# Patient Record
Sex: Male | Born: 1995 | Race: White | Hispanic: Yes | Marital: Single | Smoking: Never smoker
Health system: Southern US, Community
[De-identification: ages and names within clinical notes are randomized; demographics above are authoritative.]

## PROBLEM LIST (undated history)

## (undated) DIAGNOSIS — R109 Unspecified abdominal pain: Secondary | ICD-10-CM

## (undated) DIAGNOSIS — D649 Anemia, unspecified: Secondary | ICD-10-CM

## (undated) HISTORY — DX: Unspecified abdominal pain: R10.9

## (undated) HISTORY — PX: TONSILLECTOMY: SUR1361

## (undated) HISTORY — DX: Anemia, unspecified: D64.9

---
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
## 2015-01-21 ENCOUNTER — Emergency Department (HOSPITAL_COMMUNITY): Payer: No Typology Code available for payment source

## 2015-01-21 ENCOUNTER — Emergency Department (HOSPITAL_COMMUNITY)
Admission: EM | Admit: 2015-01-21 | Discharge: 2015-01-21 | Disposition: A | Discharge status: Home / Self Care | Payer: No Typology Code available for payment source | Attending: Emergency Medicine | Admitting: Emergency Medicine

## 2015-01-21 ENCOUNTER — Encounter (HOSPITAL_COMMUNITY): Payer: Self-pay | Admitting: *Deleted

## 2015-01-21 DIAGNOSIS — Y92410 Unspecified street and highway as the place of occurrence of the external cause: Secondary | ICD-10-CM | POA: Insufficient documentation

## 2015-01-21 DIAGNOSIS — Z862 Personal history of diseases of the blood and blood-forming organs and certain disorders involving the immune mechanism: Secondary | ICD-10-CM | POA: Insufficient documentation

## 2015-01-21 DIAGNOSIS — Y998 Other external cause status: Secondary | ICD-10-CM | POA: Insufficient documentation

## 2015-01-21 DIAGNOSIS — Y9389 Activity, other specified: Secondary | ICD-10-CM | POA: Insufficient documentation

## 2015-01-21 DIAGNOSIS — S4992XA Unspecified injury of left shoulder and upper arm, initial encounter: Secondary | ICD-10-CM | POA: Diagnosis present

## 2015-01-21 DIAGNOSIS — S3992XA Unspecified injury of lower back, initial encounter: Secondary | ICD-10-CM | POA: Insufficient documentation

## 2015-01-21 DIAGNOSIS — S46812A Strain of other muscles, fascia and tendons at shoulder and upper arm level, left arm, initial encounter: Secondary | ICD-10-CM

## 2015-01-21 DIAGNOSIS — S46912A Strain of unspecified muscle, fascia and tendon at shoulder and upper arm level, left arm, initial encounter: Secondary | ICD-10-CM | POA: Diagnosis not present

## 2015-01-21 DIAGNOSIS — S40012A Contusion of left shoulder, initial encounter: Secondary | ICD-10-CM

## 2015-01-21 MED ORDER — METHOCARBAMOL 500 MG PO TABS
500.0000 mg | ORAL_TABLET | Freq: Three times a day (TID) | ORAL | Status: DC
Start: 2015-01-21 — End: 2018-03-04

## 2015-01-21 MED ORDER — IBUPROFEN 800 MG PO TABS
800.0000 mg | ORAL_TABLET | Freq: Once | ORAL | Status: AC
  Administered 2015-01-21: 800 mg via ORAL
  Filled 2015-01-21: qty 1

## 2015-01-21 MED ORDER — IBUPROFEN 600 MG PO TABS: 600.0000 mg | ORAL_TABLET | Freq: Four times a day (QID) | ORAL | Status: DC | PRN

## 2015-01-21 MED ORDER — ACETAMINOPHEN 325 MG PO TABS
650.0000 mg | ORAL_TABLET | Freq: Once | ORAL | Status: AC
  Administered 2015-01-21: 650 mg via ORAL
  Filled 2015-01-21: qty 2
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
## 2015-09-14 ENCOUNTER — Encounter: Payer: Self-pay | Admitting: Family Medicine

## 2015-09-14 ENCOUNTER — Ambulatory Visit (INDEPENDENT_AMBULATORY_CARE_PROVIDER_SITE_OTHER): Payer: Self-pay | Admitting: Family Medicine

## 2015-09-14 VITALS — BP 132/70 | Temp 98.5°F | Ht 71.0 in | Wt 363.0 lb

## 2015-09-14 DIAGNOSIS — J329 Chronic sinusitis, unspecified: Secondary | ICD-10-CM

## 2015-09-14 DIAGNOSIS — J310 Chronic rhinitis: Secondary | ICD-10-CM

## 2015-09-14 MED ORDER — AZITHROMYCIN 250 MG PO TABS: ORAL_TABLET | ORAL | Status: DC

## 2015-09-14 NOTE — Progress Notes (Signed)
   Subjective:    Patient ID: Tyler Bautista, male    DOB: 06/21/1995, 20 y.o.   MRN: [id number]  Sinusitis This is a new problem. The current episode started in the past 7 days. The problem is unchanged. Maximum temperature: unmeasured. The pain is moderate. Associated symptoms include congestion, coughing, ear pain and headaches. Treatments tried: Advil. The treatment provided no relief.   Patient can only take pill form of medication. No liquids or syrups.   Pt has had bad cough, productive at times  Cong around cheeks and dim enrgy  Felt feve  Two night s ago,'  No known exposures except parents     Review of Systems  HENT: Positive for congestion and ear pain.   Respiratory: Positive for cough.   Neurological: Positive for headaches.       Objective:   Physical Exam  Alert mild malaise.Alert, mild malaise. Hydration good Vitals stable. frontal/ maxillary tenderness evident positive nasal congestion. pharynx normal neck supple  lungs clear/no crackles or wheezes. heart regular in rhythm       Assessment & Plan:  Impression rhinosinusitis likely post viral, discussed with patient. plan antibiotics prescribed. Questions answered. Symptomatic care discussed. warning signs discussed. WSL
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
## 2018-03-04 ENCOUNTER — Encounter (HOSPITAL_COMMUNITY): Payer: Self-pay | Admitting: Emergency Medicine

## 2018-03-04 ENCOUNTER — Ambulatory Visit (HOSPITAL_COMMUNITY)
Admission: EM | Admit: 2018-03-04 | Discharge: 2018-03-04 | Disposition: A | Discharge status: Home / Self Care | Payer: BLUE CROSS/BLUE SHIELD | Attending: Family Medicine | Admitting: Family Medicine

## 2018-03-04 DIAGNOSIS — B349 Viral infection, unspecified: Secondary | ICD-10-CM | POA: Diagnosis not present

## 2018-03-04 MED ORDER — BENZONATATE 100 MG PO CAPS: 100.0000 mg | ORAL_CAPSULE | Freq: Three times a day (TID) | ORAL | 0 refills | Status: AC

## 2018-03-04 MED ORDER — FLUTICASONE PROPIONATE 50 MCG/ACT NA SUSP: 2.0000 | Freq: Every day | NASAL | 0 refills | Status: AC

## 2018-03-04 MED ORDER — PREDNISONE 50 MG PO TABS: 50.0000 mg | ORAL_TABLET | Freq: Every day | ORAL | 0 refills | Status: AC

## 2018-03-04 MED ORDER — IPRATROPIUM BROMIDE 0.06 % NA SOLN: 2.0000 | Freq: Four times a day (QID) | NASAL | 0 refills | Status: AC

## 2018-03-04 NOTE — ED Provider Notes (Signed)
[facility]    CSN: [id number] Arrival date & time: 03/04/18  1812     History   Chief Complaint Chief Complaint  Patient presents with  . URI  . Generalized Body Aches    HPI DMITRIY GAIR is a 22 y.o. male.   22 year old male comes in for 2 day history of URI symptoms. Cough, rhinorrhea, left upper back pain, body aches. Subjective fever with cold chills and sweats. Nausea without vomiting. Mild dizziness, without weakness, lightheadedness, syncope. Has not been drinking fluids. otc cold medicine with some relief of chest congestion. Rare THC use. No sick contact.      Past Medical History:  Diagnosis Date  . Abdominal pain   . Anemia     Patient Active Problem List   Diagnosis Date Noted  . Pilonidal cyst 04/29/2013    Past Surgical History:  Procedure Laterality Date  . TONSILLECTOMY         Home Medications    Prior to Admission medications   Medication Sig Start Date End Date Taking? Authorizing Provider  benzonatate (TESSALON) 100 MG capsule Take 1 capsule (100 mg total) by mouth every 8 (eight) hours. 03/04/18   Cathie Hoops,  V, PA-C  fluticasone (FLONASE) 50 MCG/ACT nasal spray Place 2 sprays into both nostrils daily. 03/04/18   Cathie Hoops,  V, PA-C  ipratropium (ATROVENT) 0.06 % nasal spray Place 2 sprays into both nostrils 4 (four) times daily. 03/04/18   Cathie Hoops,  V, PA-C  predniSONE (DELTASONE) 50 MG tablet Take 1 tablet (50 mg total) by mouth daily. 03/04/18   Belinda Fisher, PA-C    Family History Family History  Problem Relation Age of Onset  . Colon cancer Unknown   . Stomach cancer Unknown   . Brain cancer Unknown   . Liver cancer Paternal Grandfather     Social History Social History   Tobacco Use  . Smoking status: Never Smoker  Substance Use Topics  . Alcohol use: Yes    Comment: Occ  . Drug use: Not on file     Allergies   Patient has no known allergies.   Review of Systems Review of Systems  Reason unable to perform ROS:  See HPI as above.     Physical Exam Triage Vital Signs ED Triage Vitals  Enc Vitals Group     BP 03/04/18 1921 (!) 146/99     Pulse Rate 03/04/18 1921 (!) 109     Resp 03/04/18 1921 18     Temp 03/04/18 1921 97.6 F (36.4 C)     Temp Source 03/04/18 1921 Oral     SpO2 03/04/18 1921 98 %     Weight --      Height --      Head Circumference --      Peak Flow --      Pain Score 03/04/18 1922 8     Pain Loc --      Pain Edu? --      Excl. in GC? --    No data found.  Updated Vital Signs BP (!) 146/99 (BP Location: Left Arm)   Pulse (!) 109   Temp 97.6 F (36.4 C) (Oral)   Resp 18   SpO2 98%   Physical Exam  Constitutional: He is oriented to person, place, and time. He appears well-developed and well-nourished. No distress.  HENT:  Head: Normocephalic and atraumatic.  Right Ear: Tympanic membrane, external ear and ear canal normal. Tympanic membrane  is not erythematous and not bulging.  Left Ear: Tympanic membrane, external ear and ear canal normal. Tympanic membrane is not erythematous and not bulging.  Nose: Right sinus exhibits maxillary sinus tenderness. Right sinus exhibits no frontal sinus tenderness. Left sinus exhibits maxillary sinus tenderness. Left sinus exhibits no frontal sinus tenderness.  Mouth/Throat: Uvula is midline, oropharynx is clear and moist and mucous membranes are normal.  Eyes: Pupils are equal, round, and reactive to light. Conjunctivae are normal.  Neck: Normal range of motion. Neck supple. No spinous process tenderness and no muscular tenderness present. Normal range of motion present.  Cardiovascular: Normal rate, regular rhythm and normal heart sounds. Exam reveals no gallop and no friction rub.  No murmur heard. Pulmonary/Chest: Effort normal and breath sounds normal. No stridor. No respiratory distress. He has no decreased breath sounds. He has no wheezes. He has no rhonchi. He has no rales.  Musculoskeletal:  Diffuse tenderness to palpation  of left upper back  Lymphadenopathy:    He has no cervical adenopathy.  Neurological: He is alert and oriented to person, place, and time.  Skin: Skin is warm and dry.  Psychiatric: He has a normal mood and affect. His behavior is normal. Judgment normal.     UC Treatments / Results  Labs (all labs ordered are listed, but only abnormal results are displayed) Labs Reviewed - No data to display  EKG None  Radiology No results found.  Procedures Procedures (including critical care time)  Medications Ordered in UC Medications - No data to display  Initial Impression / Assessment and Plan / UC Course  I have reviewed the triage vital signs and the nursing notes.  Pertinent labs & imaging results that were available during my care of the patient were reviewed by me and considered in my medical decision making (see chart for details).    Discussed with patient history and exam most consistent with viral URI. Symptomatic treatment as needed. Push fluids. Return precautions given.   Final Clinical Impressions(s) / UC Diagnoses   Final diagnoses:  Viral illness    ED Prescriptions    Medication Sig Dispense Auth. Provider   benzonatate (TESSALON) 100 MG capsule Take 1 capsule (100 mg total) by mouth every 8 (eight) hours. 21 capsule ,  V, PA-C   fluticasone (FLONASE) 50 MCG/ACT nasal spray Place 2 sprays into both nostrils daily. 1 g ,  V, PA-C   ipratropium (ATROVENT) 0.06 % nasal spray Place 2 sprays into both nostrils 4 (four) times daily. 15 mL ,  V, PA-C   predniSONE (DELTASONE) 50 MG tablet Take 1 tablet (50 mg total) by mouth daily. 5 tablet Threasa Alpha,  V, PA-C        ,  V, [place] 03/04/18 2007

## 2018-03-04 NOTE — ED Triage Notes (Signed)
Pt sts URI sx with body aches  

## 2018-03-04 NOTE — Discharge Instructions (Signed)
Prednisone for cough and body aches. Tessalon for cough. Start flonase, atrovent nasal spray for nasal congestion/drainage. You can use over the counter nasal saline rinse such as neti pot for nasal congestion. Keep hydrated, your urine should be clear to pale yellow in color. Tylenol/motrin for fever and pain. Monitor for any worsening of symptoms, chest pain, shortness of breath, wheezing, swelling of the throat, follow up for reevaluation.   For sore throat/cough try using a honey-based tea. Use 3 teaspoons of honey with juice squeezed from half lemon. Place shaved pieces of ginger into 1/2-1 cup of water and warm over stove top. Then mix the ingredients and repeat every 4 hours as needed.

## 2018-12-11 ENCOUNTER — Other Ambulatory Visit: Payer: Self-pay

## 2018-12-11 DIAGNOSIS — Z20822 Contact with and (suspected) exposure to covid-19: Secondary | ICD-10-CM

## 2018-12-12 LAB — NOVEL CORONAVIRUS, NAA: SARS-CoV-2, NAA: NOT DETECTED

## 2019-07-11 ENCOUNTER — Ambulatory Visit: Payer: Self-pay | Attending: Internal Medicine

## 2019-07-11 DIAGNOSIS — Z23 Encounter for immunization: Secondary | ICD-10-CM

## 2019-07-11 NOTE — Progress Notes (Signed)
   Covid-19 Vaccination Clinic  Name:  FLEET HIGHAM    MRN: [id number] DOB: 1995/11/03  07/11/2019  Mr. Odonoghue was observed post Covid-19 immunization for 15 minutes without incident. He was provided with Vaccine Information Sheet and instruction to access the V-Safe system.   Mr. Grim was instructed to call 911 with any severe reactions post vaccine: Marland Kitchen Difficulty breathing  . Swelling of face and throat  . A fast heartbeat  . A bad rash all over body  . Dizziness and weakness   Immunizations Administered    Name Date Dose VIS Date Route   Pfizer COVID-19 Vaccine 07/11/2019 12:32 PM 0.3 mL 03/13/2019 Intramuscular   Manufacturer: [employer], [employer]   Lot: [id number]   NDC: [address]-3

## 2019-08-05 ENCOUNTER — Ambulatory Visit: Payer: Self-pay | Attending: Internal Medicine

## 2019-08-05 DIAGNOSIS — Z23 Encounter for immunization: Secondary | ICD-10-CM

## 2019-08-05 NOTE — Progress Notes (Signed)
   Covid-19 Vaccination Clinic  Name:  Tyler Bautista    MRN: [id number] DOB: [birth year]  08/05/2019  Tyler Bautista was observed post Covid-19 immunization for 15 minutes without incident. He was provided with Vaccine Information Sheet and instruction to access the V-Safe system.   Tyler Bautista was instructed to call 911 with any severe reactions post vaccine: Marland Kitchen Difficulty breathing  . Swelling of face and throat  . A fast heartbeat  . A bad rash all over body  . Dizziness and weakness   Immunizations Administered    Name Date Dose VIS Date Route   Pfizer COVID-19 Vaccine 08/05/2019  1:11 PM 0.3 mL 05/27/2018 Intramuscular   Manufacturer: [employer], [employer]   Lot: [id number]   NDC: [address]-5

## 2023-02-08 ENCOUNTER — Other Ambulatory Visit (HOSPITAL_COMMUNITY): Payer: Self-pay

## 2023-02-08 ENCOUNTER — Encounter (HOSPITAL_COMMUNITY): Payer: Self-pay

## 2023-02-08 ENCOUNTER — Ambulatory Visit (INDEPENDENT_AMBULATORY_CARE_PROVIDER_SITE_OTHER): Payer: Self-pay

## 2023-02-08 ENCOUNTER — Ambulatory Visit (HOSPITAL_COMMUNITY)
Admission: RE | Admit: 2023-02-08 | Discharge: 2023-02-08 | Disposition: A | Discharge status: Home / Self Care | Payer: Self-pay | Source: Ambulatory Visit | Attending: Internal Medicine | Admitting: Internal Medicine

## 2023-02-08 VITALS — BP 155/106 | HR 122 | Temp 98.6°F | Resp 20

## 2023-02-08 DIAGNOSIS — R059 Cough, unspecified: Secondary | ICD-10-CM

## 2023-02-08 DIAGNOSIS — J019 Acute sinusitis, unspecified: Secondary | ICD-10-CM

## 2023-02-08 MED ORDER — AMOXICILLIN-POT CLAVULANATE 875-125 MG PO TABS: 1.0000 | ORAL_TABLET | Freq: Two times a day (BID) | ORAL | 0 refills | Status: AC

## 2023-02-08 MED ORDER — PROMETHAZINE-DM 6.25-15 MG/5ML PO SYRP: 5.0000 mL | ORAL_SOLUTION | Freq: Four times a day (QID) | ORAL | 0 refills | Status: AC | PRN

## 2023-02-08 NOTE — ED Triage Notes (Addendum)
Pt presents with a constant headache, n/v, loss of appetite, dizziness, fever (highest reported to be 102 F at home), cough, congestion, fatigue, and sore throat x 11 days. Pt states he has taken "vitamin C, zinc, sudafed, Theraflu tea bags, and robitussin with honey" for symptom relief, however "none of the medications are working." Last dose of Vitamin C and Zinc was this AM.

## 2023-02-08 NOTE — ED Provider Notes (Signed)
[facility]    CSN: [id number] Arrival date & time: 02/08/23  1835      History   Chief Complaint Chief Complaint  Patient presents with   Headache   Cough   Fatigue   Emesis   Sore Throat    HPI Tyler Bautista is a 27 y.o. male.    Headache Associated symptoms: cough, drainage, fatigue, fever, nausea, sore throat and vomiting   Associated symptoms: no abdominal pain and no sinus pressure   Cough Associated symptoms: chest pain, chills, fever, headaches, rhinorrhea and sore throat   Associated symptoms: no shortness of breath   Emesis Associated symptoms: chills, cough, fever, headaches and sore throat   Associated symptoms: no abdominal pain   Sore Throat Associated symptoms include chest pain and headaches. Pertinent negatives include no abdominal pain and no shortness of breath.  Sick for 11 days symptoms include rhinorrhea, nasal congestion, cough, fevers intermittently up to 102 last fever this morning, body aches, fatigue, sore throat, vomiting.  Chest and back soreness which she attributes to coughing.  Admits multiple family contacts with similar illness but they all improved in a few days.  Had a rapid COVID test at home today which was negative.  Been taking a variety of over-the-counter medicines including vitamin C, zinc, Sudafed, Robitussin, TheraFlu without relief Denies change in voice, diarrhea, abdominal pain, rash, ear pain  Past Medical History:  Diagnosis Date   Abdominal pain    Anemia     Patient Active Problem List   Diagnosis Date Noted   Pilonidal cyst 04/29/2013    Past Surgical History:  Procedure Laterality Date   TONSILLECTOMY         Home Medications    Prior to Admission medications   Medication Sig Start Date End Date Taking? Authorizing Provider  benzonatate (TESSALON) 100 MG capsule Take 1 capsule (100 mg total) by mouth every 8 (eight) hours. 03/04/18   Cathie Hoops, Amy V, PA-C  fluticasone (FLONASE) 50 MCG/ACT nasal  spray Place 2 sprays into both nostrils daily. 03/04/18   Cathie Hoops, Amy V, PA-C  ipratropium (ATROVENT) 0.06 % nasal spray Place 2 sprays into both nostrils 4 (four) times daily. 03/04/18   Cathie Hoops, Amy V, PA-C  predniSONE (DELTASONE) 50 MG tablet Take 1 tablet (50 mg total) by mouth daily. 03/04/18   Belinda Fisher, PA-C    Family History Family History  Problem Relation Age of Onset   Liver cancer Paternal Grandfather    Colon cancer Other    Stomach cancer Other    Brain cancer Other     Social History Social History   Tobacco Use   Smoking status: Never   Smokeless tobacco: Never  Vaping Use   Vaping status: Never Used  Substance Use Topics   Alcohol use: Yes    Comment: Occ   Drug use: Never     Allergies   Latex   Review of Systems Review of Systems  Constitutional:  Positive for chills, fatigue and fever.  HENT:  Positive for postnasal drip, rhinorrhea, sinus pain and sore throat. Negative for ear discharge, sinus pressure and trouble swallowing.   Respiratory:  Positive for cough. Negative for shortness of breath.   Cardiovascular:  Positive for chest pain.  Gastrointestinal:  Positive for nausea and vomiting. Negative for abdominal pain.  Neurological:  Positive for headaches.     Physical Exam Triage Vital Signs ED Triage Vitals  Encounter Vitals Group     BP 02/08/23  1927 (!) 155/106     Systolic BP Percentile --      Diastolic BP Percentile --      Pulse Rate 02/08/23 1927 (!) 122     Resp 02/08/23 1927 20     Temp 02/08/23 1927 98.6 F (37 C)     Temp Source 02/08/23 1927 Oral     SpO2 02/08/23 1927 95 %     Weight --      Height --      Head Circumference --      Peak Flow --      Pain Score 02/08/23 1931 8     Pain Loc --      Pain Education --      Exclude from Growth Chart --    No data found.  Updated Vital Signs BP (!) 155/106 (BP Location: Left Arm)   Pulse (!) 122   Temp 98.6 F (37 C) (Oral)   Resp 20   SpO2 95%   Visual Acuity Right Eye  Distance:   Left Eye Distance:   Bilateral Distance:    Right Eye Near:   Left Eye Near:    Bilateral Near:     Physical Exam Vitals reviewed.  Constitutional:      Appearance: He is well-developed. He is obese. He is not ill-appearing.  HENT:     Head: Normocephalic and atraumatic.     Mouth/Throat:     Mouth: Mucous membranes are moist.     Pharynx: Oropharynx is clear.     Comments: No erythema, exudate, tonsillar enlargement.  Uvula is midline, normal swallowing, clear voice Cardiovascular:     Rate and Rhythm: Regular rhythm. Tachycardia present.  Pulmonary:     Effort: Pulmonary effort is normal. No respiratory distress.     Breath sounds: Normal breath sounds. No wheezing or rales.  Musculoskeletal:     Cervical back: Neck supple.  Lymphadenopathy:     Cervical: No cervical adenopathy.  Neurological:     Mental Status: He is alert and oriented to person, place, and time.  Psychiatric:        Mood and Affect: Mood normal.      UC Treatments / Results  Labs (all labs ordered are listed, but only abnormal results are displayed) Labs Reviewed - No data to display  EKG   Radiology No results found.  Procedures Procedures (including critical care time)  Medications Ordered in UC Medications - No data to display  Initial Impression / Assessment and Plan / UC Course  I have reviewed the triage vital signs and the nursing notes.  Pertinent labs & imaging results that were available during my care of the patient were reviewed by me and considered in my medical decision making (see chart for details).     27 year old male reports days of flulike illness including fever, chills, fatigue, rhinorrhea, nasal congestion, sore throat, cough, chest pain, headache.  Family contacts with similar symptoms.  Home COVID test today was negative.  He is hypertensive and tachycardic on exam, is afebrile and nontoxic-appearing.  Lungs were clear to auscultation, chest x-ray was  obtained to rule out pneumonia, chest x-ray independently viewed by me no evidence of infection .  With antibiotics for possible sinus infection, and strict follow-up and emergency room precautions .  Final Clinical Impressions(s) / UC Diagnoses   Final diagnoses:  None   Discharge Instructions   None    ED Prescriptions   None    PDMP not  reviewed this encounter.   Meliton Rattan, [place] 02/08/23 2025

## 2023-02-08 NOTE — Discharge Instructions (Addendum)
The x-ray reading we discussed is preliminary. Your x-ray will be read by a radiologist in next few hours. If there is a discrepancy, you will be contacted, and instructed on a new plan for you care.   Follow-up with your primary care provider Monday if no improvement Go to the emergency room for new or severe symptoms, concerns :specifically for worsening fever, chest pain or shortness of breath

## 2024-04-25 ENCOUNTER — Telehealth: Payer: Self-pay
